# Patient Record
Sex: Female | Born: 1945 | Race: White | Hispanic: No | Marital: Married | Smoking: Never smoker
Health system: Southern US, Community
[De-identification: ages and names within clinical notes are randomized; demographics above are authoritative.]

## PROBLEM LIST (undated history)

## (undated) DIAGNOSIS — E7800 Pure hypercholesterolemia, unspecified: Secondary | ICD-10-CM

## (undated) DIAGNOSIS — I4891 Unspecified atrial fibrillation: Secondary | ICD-10-CM

## (undated) DIAGNOSIS — K219 Gastro-esophageal reflux disease without esophagitis: Secondary | ICD-10-CM

## (undated) HISTORY — PX: CATARACT EXTRACTION: SUR2

## (undated) HISTORY — PX: CYSTOCELE REPAIR: SHX163

## (undated) HISTORY — PX: ABDOMINAL HYSTERECTOMY: SHX81

## (undated) HISTORY — PX: ABLATION: SHX5711

## (undated) HISTORY — PX: ROTATOR CUFF REPAIR: SHX139

---

## 2019-08-11 ENCOUNTER — Other Ambulatory Visit: Payer: Self-pay

## 2019-08-11 ENCOUNTER — Encounter (HOSPITAL_COMMUNITY): Payer: Self-pay | Admitting: *Deleted

## 2019-08-11 ENCOUNTER — Emergency Department (HOSPITAL_COMMUNITY)
Admission: EM | Admit: 2019-08-11 | Discharge: 2019-08-12 | Disposition: A | Discharge status: Home / Self Care | Payer: Medicare Other | Attending: Emergency Medicine | Admitting: Emergency Medicine

## 2019-08-11 DIAGNOSIS — Y929 Unspecified place or not applicable: Secondary | ICD-10-CM | POA: Diagnosis not present

## 2019-08-11 DIAGNOSIS — S0990XA Unspecified injury of head, initial encounter: Secondary | ICD-10-CM | POA: Diagnosis present

## 2019-08-11 DIAGNOSIS — S0101XA Laceration without foreign body of scalp, initial encounter: Secondary | ICD-10-CM

## 2019-08-11 DIAGNOSIS — Y999 Unspecified external cause status: Secondary | ICD-10-CM | POA: Insufficient documentation

## 2019-08-11 DIAGNOSIS — S20229A Contusion of unspecified back wall of thorax, initial encounter: Secondary | ICD-10-CM | POA: Diagnosis not present

## 2019-08-11 DIAGNOSIS — D329 Benign neoplasm of meninges, unspecified: Secondary | ICD-10-CM

## 2019-08-11 DIAGNOSIS — Y9301 Activity, walking, marching and hiking: Secondary | ICD-10-CM | POA: Insufficient documentation

## 2019-08-11 DIAGNOSIS — W19XXXA Unspecified fall, initial encounter: Secondary | ICD-10-CM

## 2019-08-11 DIAGNOSIS — S20221A Contusion of right back wall of thorax, initial encounter: Secondary | ICD-10-CM

## 2019-08-11 DIAGNOSIS — Z7901 Long term (current) use of anticoagulants: Secondary | ICD-10-CM | POA: Insufficient documentation

## 2019-08-11 DIAGNOSIS — W108XXA Fall (on) (from) other stairs and steps, initial encounter: Secondary | ICD-10-CM | POA: Insufficient documentation

## 2019-08-11 HISTORY — DX: Pure hypercholesterolemia, unspecified: E78.00

## 2019-08-11 HISTORY — DX: Unspecified atrial fibrillation: I48.91

## 2019-08-11 HISTORY — DX: Gastro-esophageal reflux disease without esophagitis: K21.9

## 2019-08-11 NOTE — ED Triage Notes (Signed)
Pt reporting she tripped and fell, striking her head on a cement step. She has lac to back of her head and mid back pain. No LOC, on Pradaxa. No dizziness or change in vision.

## 2019-08-12 ENCOUNTER — Emergency Department (HOSPITAL_COMMUNITY): Payer: Medicare Other

## 2019-08-12 NOTE — ED Notes (Signed)
ED Provider at bedside. 

## 2019-08-12 NOTE — ED Provider Notes (Addendum)
[facility] EMERGENCY DEPARTMENT Provider Note  CSN: [id number] Arrival date & time: 08/11/19 2308  Chief Complaint(s) Fall  HPI Deborah Zimmerman is a 73 y.o. 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  Patient reports that while walking up the stairs at her [employer], she missed the handrail causing her to lose her balance and fall.  She hit the back of her head and right upper back.  She denies any loss of consciousness or amnesia to the event.  She endorses mild posterior scalp pain.  She is endorsing mild to moderate right upper back/parascapular pain worse with breathing and range of motion.  Alleviated by mobility.  Denies any neck pain, midline back pain, extremity pain.  Denies any other physical complaints.  HPI  Past Medical History Past Medical History:  Diagnosis Date  . A-fib ([place])   . GERD (gastroesophageal reflux disease)   . High cholesterol    There are no active problems to display for this patient.  Home Medication(s) Prior to Admission medications   Not on File                                                                                                                                    Past Surgical History Past Surgical History:  Procedure Laterality Date  . ABDOMINAL HYSTERECTOMY    . ABLATION    . CATARACT EXTRACTION    . CYSTOCELE REPAIR    . ROTATOR CUFF REPAIR     Family History No family history on file.  Social History Social History   Tobacco Use  . Smoking status: Never Smoker  Substance Use Topics  . Alcohol use: Not Currently  . Drug use: Not Currently   Allergies Macrobid [nitrofurantoin]  Review of Systems Review of Systems All other systems are reviewed and are negative for acute change except as noted in the HPI  Physical Exam Vital Signs  I have reviewed the triage vital  signs BP 135/70   Pulse 71   Temp 98 F (36.7 C) (Oral)   Resp 16   SpO2 98%   Physical Exam Constitutional:      General: She is not in acute distress.    Appearance: She is well-developed. She is not diaphoretic.  HENT:     Head: Normocephalic. Laceration present.      Right Ear: External ear normal.     Left Ear: External ear normal.     Nose: Nose normal.  Eyes:     General: No scleral icterus.       Right eye: No discharge.        Left eye: No discharge.     Conjunctiva/sclera: Conjunctivae normal.     Pupils: Pupils are equal, round, and reactive to light.  Neck:  Musculoskeletal: Normal range of motion and neck supple.  Cardiovascular:     Rate and Rhythm: Normal rate and regular rhythm.     Pulses:          Radial pulses are 2+ on the right side and 2+ on the left side.       Dorsalis pedis pulses are 2+ on the right side and 2+ on the left side.     Heart sounds: Normal heart sounds. No murmur. No friction rub. No gallop.   Pulmonary:     Effort: Pulmonary effort is normal. No respiratory distress.     Breath sounds: Normal breath sounds. No stridor. No wheezing.  Abdominal:     General: There is no distension.     Palpations: Abdomen is soft.     Tenderness: There is no abdominal tenderness.  Musculoskeletal:     Cervical back: She exhibits tenderness. She exhibits no bony tenderness.     Thoracic back: She exhibits no bony tenderness.     Lumbar back: She exhibits no bony tenderness.       Back:     Comments: Clavicles stable. Chest stable to AP/Lat compression. Pelvis stable to Lat compression. No obvious extremity deformity. No chest or abdominal wall contusion.  Skin:    General: Skin is warm and dry.     Findings: No erythema or rash.  Neurological:     Mental Status: She is alert and oriented to person, place, and time.     Comments: Moving all extremities     ED Results and Treatments Labs (all labs ordered are listed, but only abnormal  results are displayed) Labs Reviewed - No data to display                                                                                                                       EKG  EKG Interpretation  Date/Time:    Ventricular Rate:    PR Interval:    QRS Duration:   QT Interval:    QTC Calculation:   R Axis:     Text Interpretation:        Radiology Dg Ribs Unilateral W/chest Right  Result Date: 08/12/2019 CLINICAL DATA:  Fall EXAM: RIGHT RIBS AND CHEST - 3+ VIEW COMPARISON:  None. FINDINGS: Single-view chest demonstrates no focal opacity or pleural effusion. Mild cardiomegaly. No pneumothorax. Streaky atelectasis or scarring at the bases. Right rib series demonstrates no acute displaced right rib fracture. IMPRESSION: 1. Negative for pneumothorax or pleural effusion. 2. Negative for acute displaced right rib fracture Electronically Signed   By: Donavan Foil M.D.   On: 08/12/2019 00:49   Ct Head Wo Contrast  Result Date: 08/12/2019 CLINICAL DATA:  Head trauma. Mechanical fall striking head on sidewalk. Bleeding posteriorly. Patient on anticoagulation. EXAM: CT HEAD WITHOUT CONTRAST TECHNIQUE: Contiguous axial images were obtained from the base of the skull through the vertex without intravenous contrast. COMPARISON:  None. FINDINGS: Brain: No evidence of acute infarction,  hemorrhage, hydrocephalus, or extra-axial collection. Age related atrophy with minor chronic small vessel ischemia. Rounded calcified extra-axial lesion in the left parietal region measures approximately 11 mm and is likely a meningioma. No surrounding mass effect. Vascular: Atherosclerosis of skullbase vasculature without hyperdense vessel or abnormal calcification. Skull: No fracture or focal lesion. Sinuses/Orbits: No acute findings. Bilateral cataract extraction. Minimal opacification of lower right mastoid air cells. Other: Small right occipital scalp laceration and hematoma. Multiple scattered subcutaneous sebaceous  cysts, some of which are calcified. IMPRESSION: 1. Small right occipital scalp laceration and hematoma. No acute intracranial abnormality. No skull fracture. 2. 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 No surrounding mass effect. Electronically Signed   By: Keith Rake M.D.   On: 08/12/2019 01:05    Pertinent labs & imaging results that were available during my care of the patient were reviewed by me and considered in my medical decision making (see chart for details).  Medications Ordered in ED Medications - No data to display                                                                                                                                  Procedures .Marland KitchenLaceration Repair  Date/Time: 08/12/2019 1:44 AM Performed by: Fatima Blank, MD Authorized by: Fatima Blank, MD   Consent:    Consent obtained:  Verbal   Consent given by:  Patient   Risks discussed:  Pain and poor wound healing   Alternatives discussed:  Delayed treatment Anesthesia (see MAR for exact dosages):    Anesthesia method:  None Laceration details:    Location:  Scalp   Scalp location:  Occipital   Length (cm):  3   Depth (mm):  4 Repair type:    Repair type:  Simple Pre-procedure details:    Preparation:  Imaging obtained to evaluate for foreign bodies Exploration:    Hemostasis achieved with:  Direct pressure   Wound exploration: wound explored through full range of motion     Wound extent: no foreign bodies/material noted     Contaminated: no   Treatment:    Area cleansed with:  Saline   Amount of cleaning:  Standard   Irrigation solution:  Sterile saline   Irrigation volume:  500cc   Irrigation method:  Pressure wash Skin repair:    Repair method:  Tissue adhesive Approximation:    Approximation:  Close Post-procedure details:    Patient tolerance of procedure:  Tolerated well, no immediate complications    (including critical care time)   Medical Decision Making / ED Course I have reviewed the nursing notes for this encounter and the patient's prior records (if available in EHR or on provided paperwork).   Ozetta Frary was evaluated in Emergency Department on 08/12/2019 for the symptoms described in the history of present illness. She was evaluated in the context of the global COVID-19 pandemic, which necessitated consideration that the patient might  be at risk for infection with the SARS-CoV-2 virus that causes COVID-19. Institutional protocols and algorithms that pertain to the evaluation of patients at risk for COVID-19 are in a state of rapid change based on information released by regulatory bodies including the CDC and federal and state organizations. These policies and algorithms were followed during the patient's care in the ED.  Mechanical fall resulting in occipital scalp laceration. CT head negative. Plain film of chest and right ribs without obvious injury.  Patient does not have significant tenderness to the right upper back concerning for occult fracture.  No suspicious for soft tissue contusion versus hematoma.   Wound was irrigated and closed as above.  Incidental parietal meningioma was discussed with patient.  Outpatient follow-up recommended  The patient is safe for discharge with strict return precautions.        Final Clinical Impression(s) / ED Diagnoses Final diagnoses:  Fall, initial encounter  Laceration of scalp, initial encounter  Contusion of upper back, right, initial encounter  Meningioma [facility])      The patient appears reasonably screened and/or stabilized for discharge and I doubt any other medical condition or other [facility] requiring further screening, evaluation, or treatment in the ED at this time prior to discharge.  Disposition: Discharge  Condition: Good  I have discussed the results, Dx and Tx plan with the patient who expressed understanding and agree(s) with the plan.  Discharge instructions discussed at great length. The patient was given strict return precautions who verbalized understanding of the instructions. No further questions at time of discharge.    ED Discharge Orders    None       Follow Up: Primary care provider  Schedule an appointment as soon as possible for a visit  As needed    This chart was dictated using voice recognition software.  Despite best efforts to proofread,  errors can occur which can change the documentation meaning.     Fatima Blank, MD 08/12/19 [id number]

## 2019-08-12 NOTE — ED Notes (Signed)
Patient verbalizes understanding of discharge instructions. Opportunity for questioning and answers were provided. Armband removed by staff, pt discharged from ED ambulatory.   

## 2019-08-12 NOTE — Discharge Instructions (Addendum)
During the workup we noted incidental findings on your imaging that would require you to follow-up with your regular doctor for further evaluation/management:  11 mm parietal meningioma

## 2019-08-12 NOTE — ED Notes (Signed)
Laceration irrigated w/ NS, dermabond at bedside.

## 2020-03-05 IMAGING — CT CT HEAD W/O CM
4 series · 17 of 47 positions shown, 19 images · non-contrast
Comparison: None.

CLINICAL DATA: Head trauma. Mechanical fall striking head on
sidewalk. Bleeding posteriorly. Patient on anticoagulation.

EXAM:
CT HEAD WITHOUT CONTRAST
TECHNIQUE: Contiguous axial images were obtained from the base of the skull
through the vertex without intravenous contrast.

[Series 3: head wo · axial · 0.48mm/px · z∈[-181,-36]mm · 7 of 39 slices shown, 9 images]
[im 5/39  brain]
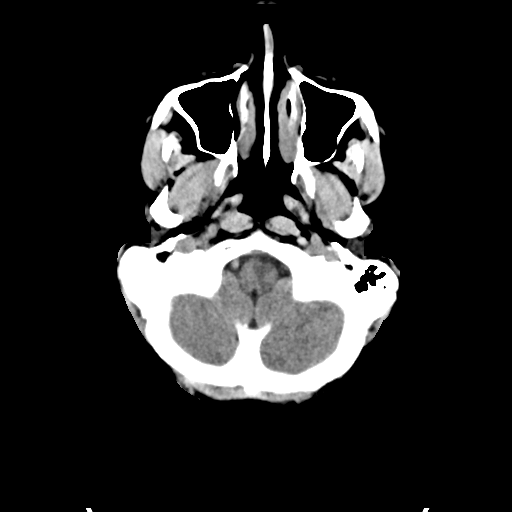
[im 5/39  bone]
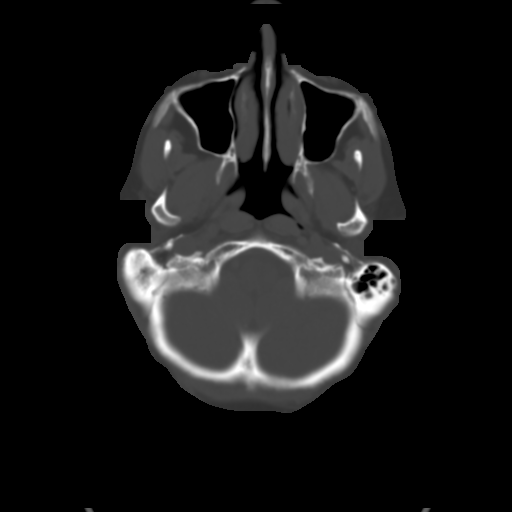
[im 10/39  brain]
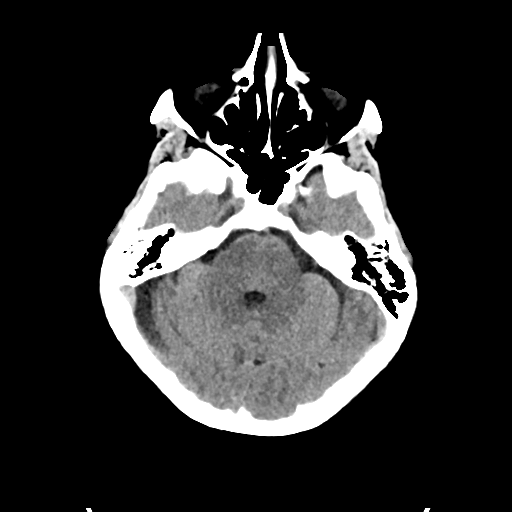
[im 15/39  brain]
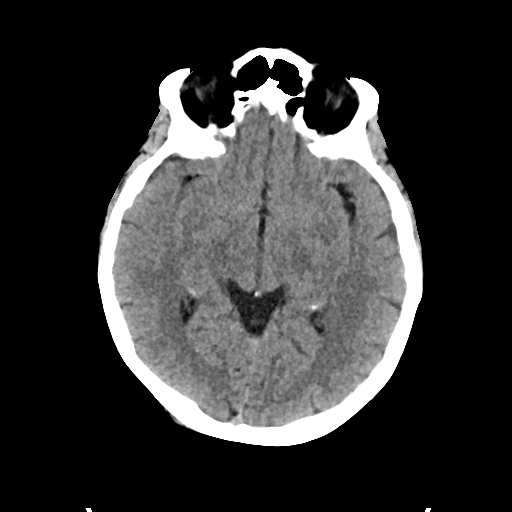
[im 20/39  brain]
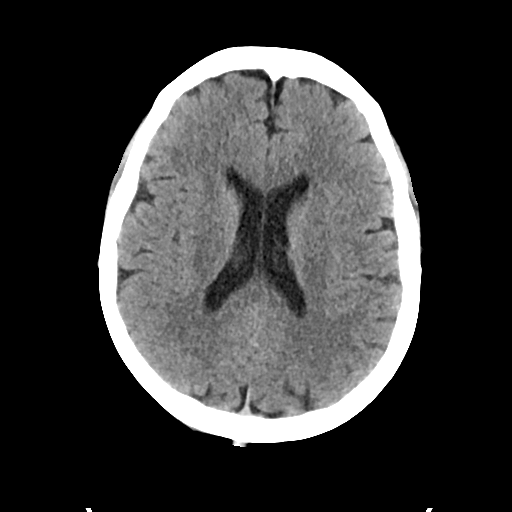
[im 24/39  brain]
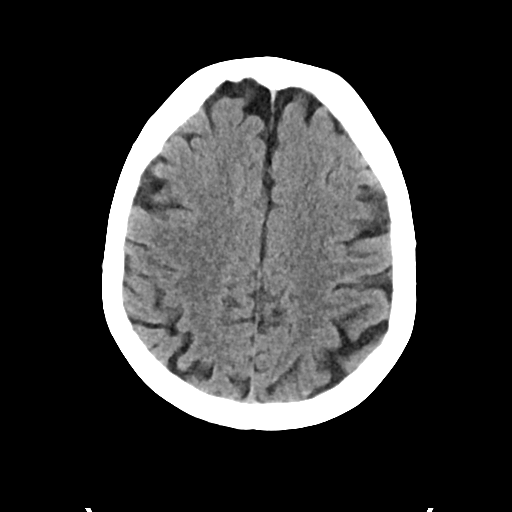
[im 24/39  bone]
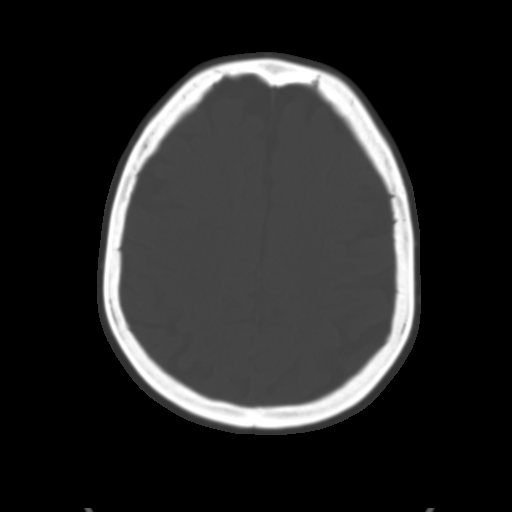
[im 29/39  brain]
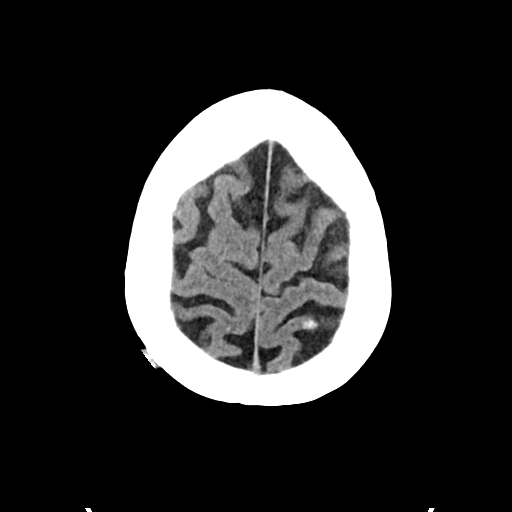
[im 34/39  brain]
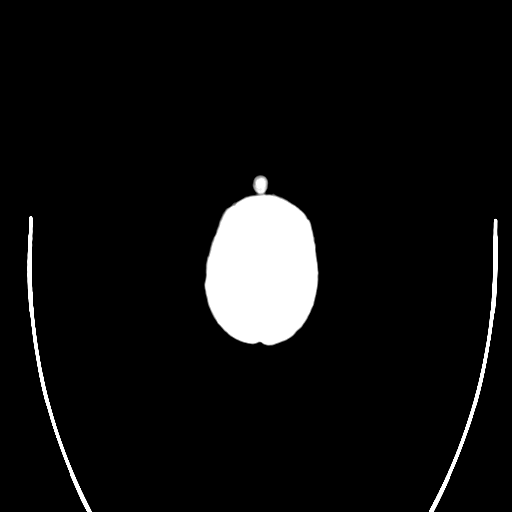

[Series 4: head bone · axial · 0.48mm/px · z∈[-183,-117]mm · 4 of 96 slices shown]
[im 10/96  bone]
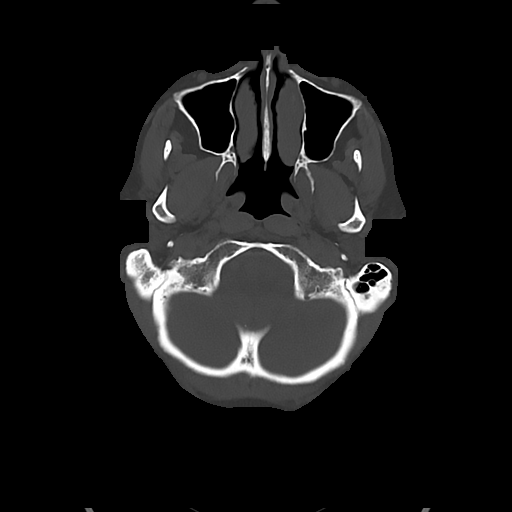
[im 20/96  bone]
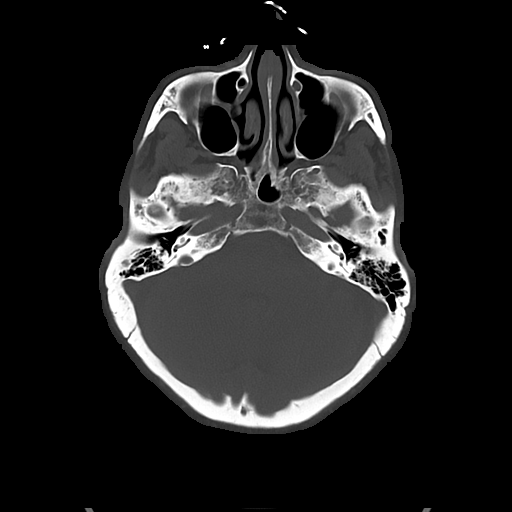
[im 29/96  bone]
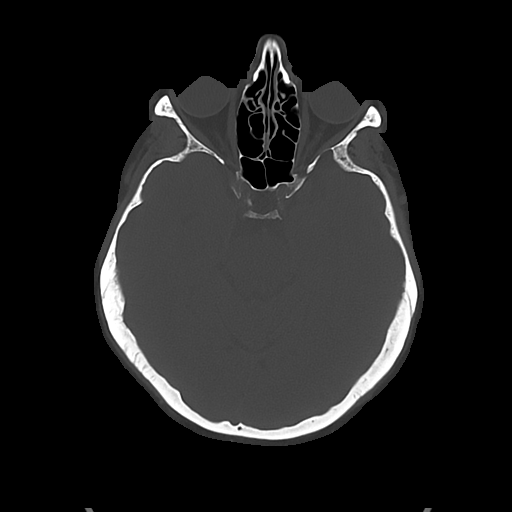
[im 43/96  bone]
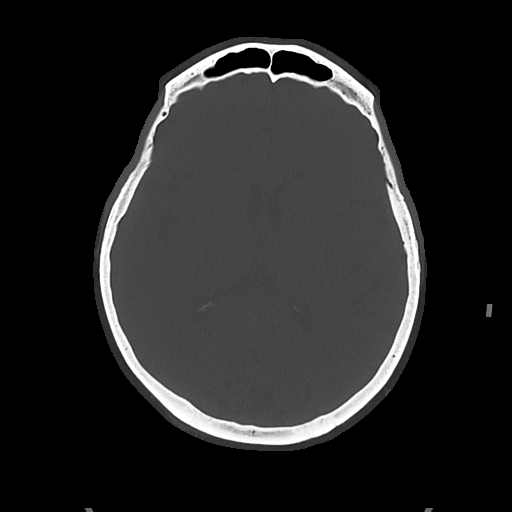

[Series 5: cor soft · coronal · 0.37mm/px · 3 of 71 slices shown]
[im 24/71  brain]
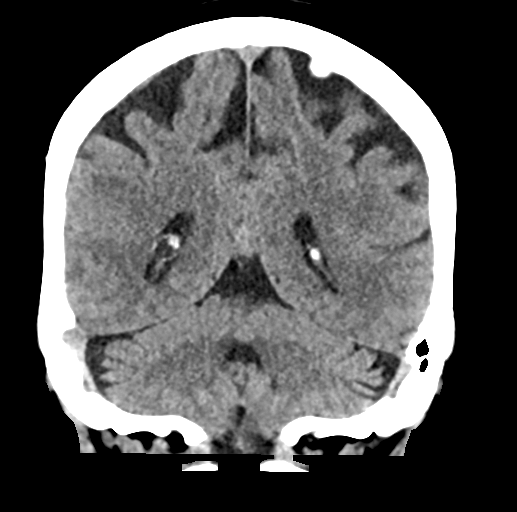
[im 32/71  brain]
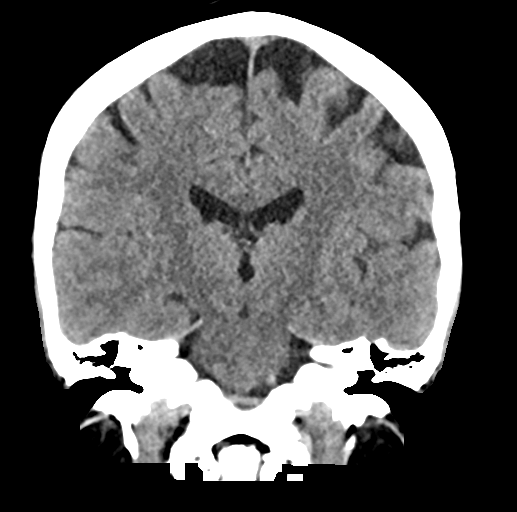
[im 39/71  brain]
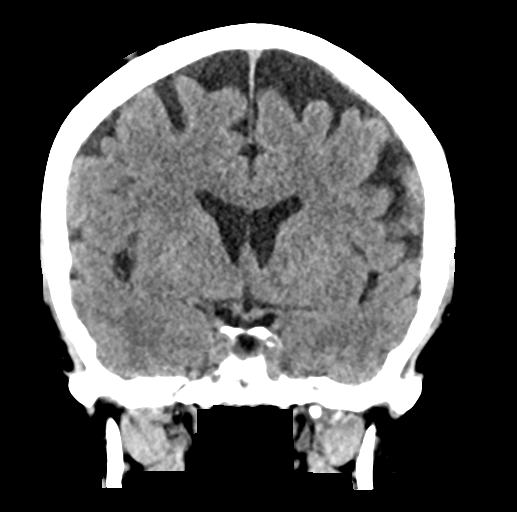

[Series 6: sag soft · sagittal · 0.37mm/px · 3 of 64 slices shown]
[im 22/64  brain]
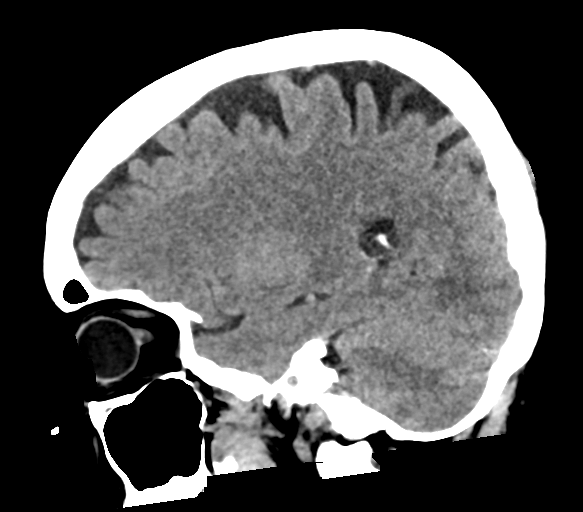
[im 32/64  brain]
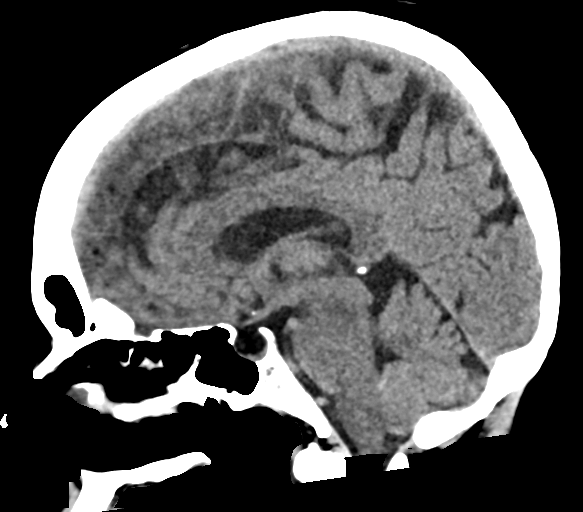
[im 43/64  brain]
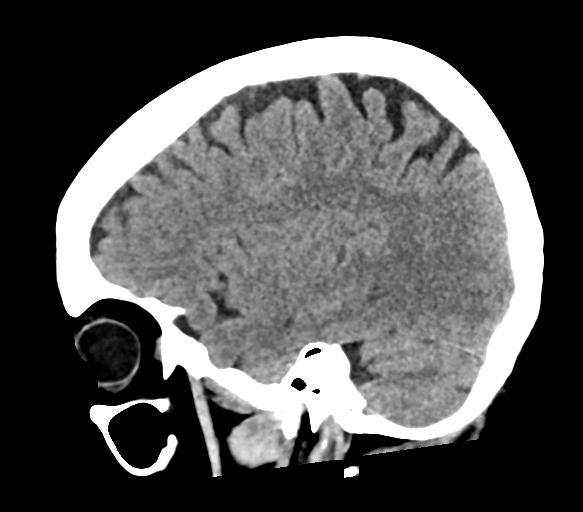

[17 of 47 positions shown; findings below may reference images not displayed]

FINDINGS: Brain: No evidence of acute infarction, hemorrhage, hydrocephalus,
or extra-axial collection. Age related atrophy with minor chronic
small vessel ischemia. Rounded calcified extra-axial lesion in the
left parietal region measures approximately 11 mm and is likely a
meningioma. No surrounding mass effect.

Vascular: Atherosclerosis of skullbase vasculature without
hyperdense vessel or abnormal calcification.

Skull: No fracture or focal lesion.

Sinuses/Orbits: No acute findings. Bilateral cataract extraction.
Minimal opacification of lower right mastoid air cells.

Other: Small right occipital scalp laceration and hematoma. Multiple
scattered subcutaneous sebaceous cysts, some of which are calcified.
IMPRESSION: 1. Small right occipital scalp laceration and hematoma. No acute
intracranial abnormality. No skull fracture.
2. Small, 11mm, incidental left parietal extra-axial meningioma. No
surrounding mass effect.
